# Patient Record
Sex: Female | Born: 1960 | Race: White | Hispanic: No | Marital: Single | Smoking: Never smoker
Health system: Southern US, Community
[De-identification: ages and names within clinical notes are randomized; demographics above are authoritative.]

## PROBLEM LIST (undated history)

## (undated) DIAGNOSIS — I10 Essential (primary) hypertension: Secondary | ICD-10-CM

---

## 2020-12-14 ENCOUNTER — Emergency Department
Admission: EM | Admit: 2020-12-14 | Discharge: 2020-12-15 | Disposition: A | Discharge status: Home / Self Care | Attending: Emergency Medicine | Admitting: Emergency Medicine

## 2020-12-14 ENCOUNTER — Emergency Department

## 2020-12-14 DIAGNOSIS — R451 Restlessness and agitation: Secondary | ICD-10-CM | POA: Insufficient documentation

## 2020-12-14 DIAGNOSIS — S0101XA Laceration without foreign body of scalp, initial encounter: Secondary | ICD-10-CM | POA: Insufficient documentation

## 2020-12-14 DIAGNOSIS — Y92149 Unspecified place in prison as the place of occurrence of the external cause: Secondary | ICD-10-CM | POA: Diagnosis not present

## 2020-12-14 DIAGNOSIS — S0990XA Unspecified injury of head, initial encounter: Secondary | ICD-10-CM

## 2020-12-14 DIAGNOSIS — W1809XA Striking against other object with subsequent fall, initial encounter: Secondary | ICD-10-CM | POA: Diagnosis not present

## 2020-12-14 DIAGNOSIS — F10120 Alcohol abuse with intoxication, uncomplicated: Secondary | ICD-10-CM | POA: Diagnosis not present

## 2020-12-14 DIAGNOSIS — F10920 Alcohol use, unspecified with intoxication, uncomplicated: Secondary | ICD-10-CM

## 2020-12-14 LAB — ETHANOL: Alcohol, Ethyl (B): 206 mg/dL — ABNORMAL HIGH (ref <10)

## 2020-12-14 LAB — COMPREHENSIVE METABOLIC PANEL
ALT: 26 U/L (ref 0–44)
AST: 45 U/L — ABNORMAL HIGH (ref 15–41)
Albumin: 3.9 g/dL (ref 3.5–5.0)
Alkaline Phosphatase: 83 U/L (ref 38–126)
Anion gap: 11 (ref 5–15)
BUN: 9 mg/dL (ref 6–20)
CO2: 23 mmol/L (ref 22–32)
Calcium: 8.9 mg/dL (ref 8.9–10.3)
Chloride: 102 mmol/L (ref 98–111)
Creatinine, Ser: 0.74 mg/dL (ref 0.44–1.00)
GFR, Estimated: >60 mL/min (ref >60)
Glucose, Bld: 108 mg/dL — ABNORMAL HIGH (ref 70–99)
Potassium: 3.7 mmol/L (ref 3.5–5.1)
Sodium: 136 mmol/L (ref 135–145)
Total Bilirubin: 0.6 mg/dL (ref 0.3–1.2)
Total Protein: 7.1 g/dL (ref 6.5–8.1)

## 2020-12-14 LAB — CBC WITH DIFFERENTIAL/PLATELET
Abs Immature Granulocytes: 0.09 10*3/uL — ABNORMAL HIGH (ref 0.00–0.07)
Basophils Absolute: 0.1 10*3/uL (ref 0.0–0.1)
Basophils Relative: 1 %
Eosinophils Absolute: 0 10*3/uL (ref 0.0–0.5)
Eosinophils Relative: 0 %
HCT: 39.1 % (ref 36.0–46.0)
Hemoglobin: 14.2 g/dL (ref 12.0–15.0)
Immature Granulocytes: 1 %
Lymphocytes Relative: 7 %
Lymphs Abs: 0.9 10*3/uL (ref 0.7–4.0)
MCH: 33.3 pg (ref 26.0–34.0)
MCHC: 36.3 g/dL — ABNORMAL HIGH (ref 30.0–36.0)
MCV: 91.8 fL (ref 80.0–100.0)
Monocytes Absolute: 0.9 10*3/uL (ref 0.1–1.0)
Monocytes Relative: 8 %
Neutro Abs: 9.8 10*3/uL — ABNORMAL HIGH (ref 1.7–7.7)
Neutrophils Relative %: 83 %
Platelets: 267 10*3/uL (ref 150–400)
RBC: 4.26 MIL/uL (ref 3.87–5.11)
RDW: 11.8 % (ref 11.5–15.5)
WBC: 11.7 10*3/uL — ABNORMAL HIGH (ref 4.0–10.5)
nRBC: 0 % (ref 0.0–0.2)

## 2020-12-14 MED ORDER — MIDAZOLAM HCL 2 MG/2ML IJ SOLN: 4.0000 mg | Freq: Once | INTRAMUSCULAR | Status: AC

## 2020-12-14 MED ORDER — MIDAZOLAM HCL 2 MG/2ML IJ SOLN
INTRAMUSCULAR | Status: AC
  Administered 2020-12-14: 4 mg via INTRAMUSCULAR
  Filled 2020-12-14: qty 4

## 2020-12-14 MED ORDER — MIDAZOLAM HCL 2 MG/2ML IJ SOLN: 2.0000 mg | Freq: Once | INTRAMUSCULAR | Status: DC

## 2020-12-14 MED ORDER — HALOPERIDOL LACTATE 5 MG/ML IJ SOLN: 2.0000 mg | Freq: Once | INTRAMUSCULAR | Status: DC

## 2020-12-14 NOTE — ED Notes (Signed)
Pt has been given water

## 2020-12-14 NOTE — ED Notes (Signed)
Pt is refusing vitals at this time. 

## 2020-12-14 NOTE — ED Notes (Addendum)
Pt came into room combative and swinging limbs at staff. Pt was verbally aggressive. Pt was combative toward staff and unwilling to participate in care.

## 2020-12-14 NOTE — ED Notes (Signed)
Pt is unwilling to give urine sample at this time.

## 2020-12-14 NOTE — ED Notes (Signed)
Pt is continually be combative to staff and verbally abusive. Pt is still not letting staff assess her injuries or finish triage.

## 2020-12-14 NOTE — ED Notes (Addendum)
Pt back from CT, continuing to refuse VS. On returning from CT, pt handcuffed on right wrist to stretcher, skin under cuff intact, mild redness noted.

## 2020-12-14 NOTE — ED Provider Notes (Signed)
[facility] Emergency Department Provider Note   ____________________________________________   Event Date/Time   First MD Initiated Contact with Patient 12/14/20 2112     (approximate)  I have reviewed the triage vital signs and the nursing notes.   HISTORY  Chief Complaint Laceration and Head Laceration  EM caveat, agitation, confusion  HPI Christina Long is a 60 y.o. female presents for evaluation after head injury  The history is somewhat unclear.  It is reported that the patient was being evaluated or booked into the jail tonight, after being there for a little while she was then noticed to have some type of a head injury possibly strike her head on jail door or something else.  Not quite clear.  EMS reports that she had bleeding, some sort of scalp laceration, they were unable to get the bleeding to stop, and the patient was extremely combative.  She had received 5 mg of intramuscular Haldol and is continued to be combative with EMS.  [place] [facility] [place] this [place] and they have accompanying her to officers  Patient yelling combatively, swearing frequently, calling almost anyone and everyone for phonatory and derogatory names.  She is quite elevated and within only a brief period of time she is yelling and threatening that she is going to sue me for evaluating her today  No past medical history on file.  There are no problems to display for this patient.     Prior to Admission medications   Not on File    Allergies Patient has no allergy information on record.  No family history on file.  Social History    Review of Systems  EM caveat     ____________________________________________   PHYSICAL EXAM:  VITAL SIGNS: ED Triage Vitals [12/14/20 2114]  Enc Vitals Group     BP      Pulse      Resp      Temp      Temp src      SpO2      Weight 135 lb (61.2 kg)     Height 5\' 6"   (1.676 m)     Head Circumference      Peak Flow      Pain Score      Pain Loc      Pain Edu?      Excl. in GC?     Constitutional: Alert and difficult to assess orientation given her level of agitation screaming and profanity laced speech. Eyes: Conjunctivae are injected. Head: Atraumatic except for a approximately 2 cm relatively superficial laceration over the posterior occipital scalp.  Fair amount of dried blood surrounding Nose: No congestion/rhinnorhea. Mouth/Throat: Mucous membranes are moist. Neck: No stridor.  Cardiovascular: Tachycardic rate, regular rhythm. Grossly normal heart sounds.  Good peripheral circulation. Respiratory: Relatively normal work of breathing, and is able to scream out and yell at the top of her lungs without difficulty. Gastrointestinal: No distention. Musculoskeletal: No lower extremity tenderness nor edema.  She is able to sit herself up and fight combatively against restraints that the sheriff has on her right hand and left leg.  No evidence of poor perfusion or injury to either extremity.  No obvious extremity injuries. Neurologic: Slurred speech elevated pressured speech.  Very highly agitated Skin:  Skin is warm, dry and intact. No rash noted. Psychiatric: Mood and affect are highly elevated threatening behavior toward staff as well [facility].  Refusing vital signs repeatedly. ____________________________________________   LABS (all labs ordered are listed, but only abnormal results are displayed)  Labs Reviewed  ETHANOL - Abnormal; Notable for the following components:      Result Value   Alcohol, Ethyl (B) 206 (*)    All other components within normal limits  CBC WITH DIFFERENTIAL/PLATELET - Abnormal; Notable for the following components:   WBC 11.7 (*)    MCHC 36.3 (*)    Neutro Abs 9.8 (*)    Abs Immature Granulocytes 0.09 (*)    All other components within normal limits  COMPREHENSIVE METABOLIC PANEL -  Abnormal; Notable for the following components:   Glucose, Bld 108 (*)    AST 45 (*)    All other components within normal limits  URINE DRUG SCREEN, QUALITATIVE ([facility] ONLY)   ____________________________________________  EKG   ____________________________________________  RADIOLOGY The head and cervical spine reviewed, read of the radiologist also reviewed.  No evidence of acute intracranial or cervical pathology.  Initial read updated after discussion with radiologist, no fractures.   ____________________________________________   PROCEDURES  Procedure(s) performed: Laceration repair  .Marland KitchenLaceration Repair  Date/Time: 12/14/2020 11:30 PM Performed by: Sharyn Creamer, MD Authorized by: Sharyn Creamer, MD   Consent:    Consent obtained:  Verbal (Patient would not consent to sutures or staples only to tissue gluing)   Consent given by:  Patient   Risks discussed:  Infection, pain, poor cosmetic result, need for additional repair, poor wound healing and retained foreign body   Alternatives discussed:  No treatment Universal protocol:    Relevant documents present and verified: yes     Imaging studies available: yes     Patient identity confirmed:  Verbally with patient Anesthesia:    Anesthesia method:  None Laceration details:    Location:  Scalp   Scalp location:  Occipital   Length (cm):  2   Depth (mm):  4 Exploration:    Limited defect created (wound extended): yes     Contaminated: no   Treatment:    Amount of cleaning:  Standard   Irrigation solution:  Sterile saline   Visualized foreign bodies/material removed: no   Skin repair:    Repair method:  Tissue adhesive Approximation:    Approximation:  Close Repair type:    Repair type:  Simple Post-procedure details:    Dressing:  Open (no dressing)   Procedure completion:  Tolerated well, no immediate complications    Critical Care performed: Yes, see critical care note(s)  CRITICAL CARE Performed by: Sharyn Creamer   Total critical care time: 35 minutes  Critical care time was exclusive of separately billable procedures and treating other patients.  Critical care was necessary to treat or prevent imminent or life-threatening deterioration.  Critical care was time spent personally by me on the following activities: development of treatment plan with patient and/or surrogate as well as nursing, discussions with consultants, evaluation of patient's response to treatment, examination of patient, obtaining history from patient or surrogate, ordering and performing treatments and interventions, ordering and review of laboratory studies, ordering and review of radiographic studies, pulse oximetry and re-evaluation of patient's condition.  ____________________________________________   INITIAL IMPRESSION / ASSESSMENT AND PLAN / ED COURSE  Pertinent labs & imaging results that were available during my care of the patient were reviewed by me and considered in my medical decision making (see chart for details).   Patient presents, evidently was being booked for concerns of possible alcohol and drug  abuse.  Evidently had no injury on presentation to the jail and then is felt to have had struck her head fallen or some type of injury occurring.  On arrival she is extremely agitated belligerent, obvious dried blood on the back of her scalp.  Unable to obtain good assessment given the patient's level of agitation and combativeness however.  Differential diagnosis certainly includes intracranial injury, unlikely cervical injury, patient will not tolerate wearing of a cervical collar at this time given her level of agitation.  I do not see obvious or gross evidence of neurologic deficit.  She does however have slurred speech, and very combative nature.  It is unclear to me if this could be related to substance abuse, metabolic abnormality, ethanol abuse, or traumatic injury.  However, I favor this may be alcohol or  drug related or precipitated given the history  Clinical Course as of 12/14/20 2345  Mon Dec 14, 2020  2114 Very combative, belligerent, yelling about and attempting to hit staff.  She has obvious bleeding summer from her scalp with concerns for head injury.  She also seems to be perseverating to some degree with slight slurring of speech.  The patient is not felt to have capacity, and I am concerned about an emergent medical condition including potential significant head injury at this point.  The patient is not able to be redirected with verbal de-escalation and is becoming progressively more combative. IV versed ordered with manual hold for administration.  [MQ]  2117 Appears to be coming somewhat, but still profanity laced speech, slurred speech, yelling at Designer, television/film set.  She is not compliant with care requests at this point but is alert, has dried blood across her scalp but I do not see evidence of active bleeding at this point though difficult to evaluate given her level of agitation when approached.  Hopefully midazolam will begin to have calming effect in combination with Haldol given by EMS [MQ]  2125 Patient yelling at me when trying assess her now, reporting I am a "gay" and to "keep your legs crossed"... near continuous use of the f word now  [MQ]  2135 Patient still somewhat agitated seems to be slowly calming.  No distress.  Still will not allow close examination due to agitation level when approached [MQ]  2235 Patient is much calmer now, compliant with care.  She allowed laceration repair with Dermabond small laceration on the scalp.  The patient adamantly refused to allow suturing or staple repair, and given the need for repair decision made to utilize Dermabond which I think use and worked quite well and effectively.  The area was cleansed irrigated previous to repair [MQ]  2329 The patient is now calm and resting.  She was able to undergo CT scan.  Laceration repaired. [MQ]     Clinical Course User Index [MQ] Sharyn Creamer, MD   Anticipate some delay in obtaining labs and blood work while we provide anxiolytic and antipsychotic medication to the patient to at least calm and facilitate evaluation in the ER.  ----------------------------------------- 11:43 PM on 12/14/2020 -----------------------------------------  Patient is resting at this time.  I suspect her elevated ethanol level is likely the cause of her agitation, possibly some related also to the fact that after he had a head injury.  She has very reassuring work-up to this point, she continues to refuse vital signs but I am able to assess that her respiratory rate is currently 14, she is resting comfortably has a strong and  normal radial pulse and is warm and well-perfused.  However the patient is refusing to allow placement of ECG electrodes and blood pressure at this time.  We will allow the patient to further be observed clinically for also for sobriety.  I suspect thereafter hopefully she will come more compliant allow [place] to complete our assessment.  I suspect if the patient mental baseline improves, agitation improved that she will likely be able to be discharged with head injury and scalp laceration  precautions.  Ongoing care signed Dr. Elesa Massed at this time  ____________________________________________   FINAL CLINICAL IMPRESSION(S) / ED DIAGNOSES  Final diagnoses:  Alcoholic intoxication without complication (HCC)  Agitation  Scalp laceration, initial encounter  Closed head injury, initial encounter        Note:  This document was prepared using Dragon voice recognition software and may include unintentional dictation errors       Sharyn Creamer, MD 12/14/20 2345

## 2020-12-15 ENCOUNTER — Other Ambulatory Visit: Payer: Self-pay

## 2020-12-15 NOTE — ED Notes (Signed)
Pt ambulatory to bathroom independently with steady gait.
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
## 2020-12-15 NOTE — ED Notes (Signed)
Pt refused discharge vital signs and discharge e-signature, verbalized understanding of discharge teaching.

## 2020-12-15 NOTE — ED Notes (Signed)
Pt provided water for fluid challenge 

## 2021-04-01 ENCOUNTER — Other Ambulatory Visit: Payer: Self-pay

## 2021-04-01 ENCOUNTER — Encounter: Payer: Self-pay | Admitting: Emergency Medicine

## 2021-04-01 ENCOUNTER — Ambulatory Visit
Admission: EM | Admit: 2021-04-01 | Discharge: 2021-04-01 | Disposition: A | Discharge status: Home / Self Care | Payer: BLUE CROSS/BLUE SHIELD | Attending: Internal Medicine | Admitting: Internal Medicine

## 2021-04-01 DIAGNOSIS — E871 Hypo-osmolality and hyponatremia: Secondary | ICD-10-CM | POA: Diagnosis not present

## 2021-04-01 DIAGNOSIS — E878 Other disorders of electrolyte and fluid balance, not elsewhere classified: Secondary | ICD-10-CM | POA: Diagnosis not present

## 2021-04-01 DIAGNOSIS — K529 Noninfective gastroenteritis and colitis, unspecified: Secondary | ICD-10-CM | POA: Diagnosis not present

## 2021-04-01 LAB — COMPREHENSIVE METABOLIC PANEL
ALT: 37 U/L (ref 0–44)
AST: 104 U/L — ABNORMAL HIGH (ref 15–41)
Albumin: 3.7 g/dL (ref 3.5–5.0)
Alkaline Phosphatase: 82 U/L (ref 38–126)
Anion gap: 11 (ref 5–15)
BUN: 7 mg/dL (ref 6–20)
CO2: 26 mmol/L (ref 22–32)
Calcium: 8.8 mg/dL — ABNORMAL LOW (ref 8.9–10.3)
Chloride: 97 mmol/L — ABNORMAL LOW (ref 98–111)
Creatinine, Ser: 0.77 mg/dL (ref 0.44–1.00)
GFR, Estimated: >60 mL/min (ref >60)
Glucose, Bld: 129 mg/dL — ABNORMAL HIGH (ref 70–99)
Potassium: 3.8 mmol/L (ref 3.5–5.1)
Sodium: 134 mmol/L — ABNORMAL LOW (ref 135–145)
Total Bilirubin: 1 mg/dL (ref 0.3–1.2)
Total Protein: 7.4 g/dL (ref 6.5–8.1)

## 2021-04-01 LAB — CBC WITH DIFFERENTIAL/PLATELET
Abs Immature Granulocytes: 0.01 10*3/uL (ref 0.00–0.07)
Basophils Absolute: 0 10*3/uL (ref 0.0–0.1)
Basophils Relative: 1 %
Eosinophils Absolute: 0.1 10*3/uL (ref 0.0–0.5)
Eosinophils Relative: 1 %
HCT: 39 % (ref 36.0–46.0)
Hemoglobin: 14.3 g/dL (ref 12.0–15.0)
Immature Granulocytes: 0 %
Lymphocytes Relative: 21 %
Lymphs Abs: 1 10*3/uL (ref 0.7–4.0)
MCH: 32.3 pg (ref 26.0–34.0)
MCHC: 36.7 g/dL — ABNORMAL HIGH (ref 30.0–36.0)
MCV: 88 fL (ref 80.0–100.0)
Monocytes Absolute: 0.5 10*3/uL (ref 0.1–1.0)
Monocytes Relative: 11 %
Neutro Abs: 3 10*3/uL (ref 1.7–7.7)
Neutrophils Relative %: 66 %
Platelets: 247 10*3/uL (ref 150–400)
RBC: 4.43 MIL/uL (ref 3.87–5.11)
RDW: 11.7 % (ref 11.5–15.5)
WBC: 4.5 10*3/uL (ref 4.0–10.5)
nRBC: 0 % (ref 0.0–0.2)

## 2021-04-01 LAB — URINALYSIS, COMPLETE (UACMP) WITH MICROSCOPIC
Bilirubin Urine: NEGATIVE
Glucose, UA: NEGATIVE mg/dL
Ketones, ur: NEGATIVE mg/dL
Leukocytes,Ua: NEGATIVE
Nitrite: NEGATIVE
Protein, ur: NEGATIVE mg/dL
Specific Gravity, Urine: 1.01 (ref 1.005–1.030)
pH: 6.5 (ref 5.0–8.0)

## 2021-04-01 LAB — GLUCOSE, CAPILLARY: Glucose-Capillary: 104 mg/dL — ABNORMAL HIGH (ref 70–99)

## 2021-04-01 NOTE — Discharge Instructions (Addendum)
Your white cell count is normal and does not show signs of bacterial infection Your urine only shows rare red cells, but no white cells which are present with infection. We will send your urine for a culture. It also shows you are well hydrated. Your chemistry shows your sodium and chloride are a little low which happen from vomiting and low sodium can be from over hydration as well. Low Chloride can also occur with poor adrenal function.  But this does not show you are dehydrated. Your glucose is a little elevated and one of your liver enzymes is a little elevated, the rest are fine.  Your blood pressure is very high and it  drops a lot from sitting to standing and this is concerning. Please go do the ER today to have more tests done.   Call [facility] for follow up Address: [address] [place], [place] [address] Hours:  Thursday 8AM-5PM Friday 8AM-4:30PM Saturday Closed Sunday Closed Monday 8AM-5PM Tuesday 8AM-5PM Wednesday 8AM-5PM Phone: [phone]
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
## 2021-04-01 NOTE — ED Triage Notes (Signed)
Pt c/o diarrhea and vomiting. Pt states she has been having these symptoms for 15 days. She was recently seen at [facility] ED and treated for UTI, and dehydration.

## 2021-04-01 NOTE — ED Notes (Signed)
Patient is being discharged from the Urgent Care and sent to the Emergency Department via POV. Per Beatrix Fetters, PA, patient is in need of higher level of care. Patient is aware and verbalizes understanding of plan of care.  Vitals:   04/01/21 1200  BP: (!) 183/130  Pulse: 95  Resp: 18  Temp: 98.7 F (37.1 C)  SpO2: 99%

## 2021-04-03 LAB — URINE CULTURE

## 2021-04-29 ENCOUNTER — Emergency Department
Admission: EM | Admit: 2021-04-29 | Discharge: 2021-04-29 | Disposition: A | Discharge status: Home / Self Care | Payer: BLUE CROSS/BLUE SHIELD | Attending: Emergency Medicine | Admitting: Emergency Medicine

## 2021-04-29 ENCOUNTER — Other Ambulatory Visit: Payer: Self-pay

## 2021-04-29 DIAGNOSIS — Y908 Blood alcohol level of 240 mg/100 ml or more: Secondary | ICD-10-CM | POA: Insufficient documentation

## 2021-04-29 DIAGNOSIS — Z5321 Procedure and treatment not carried out due to patient leaving prior to being seen by health care provider: Secondary | ICD-10-CM | POA: Insufficient documentation

## 2021-04-29 DIAGNOSIS — F10988 Alcohol use, unspecified with other alcohol-induced disorder: Secondary | ICD-10-CM | POA: Insufficient documentation

## 2021-04-29 DIAGNOSIS — Z20822 Contact with and (suspected) exposure to covid-19: Secondary | ICD-10-CM | POA: Insufficient documentation

## 2021-04-29 DIAGNOSIS — R451 Restlessness and agitation: Secondary | ICD-10-CM | POA: Diagnosis present

## 2021-04-29 DIAGNOSIS — R233 Spontaneous ecchymoses: Secondary | ICD-10-CM | POA: Insufficient documentation

## 2021-04-29 DIAGNOSIS — R197 Diarrhea, unspecified: Secondary | ICD-10-CM | POA: Diagnosis not present

## 2021-04-29 DIAGNOSIS — F1290 Cannabis use, unspecified, uncomplicated: Secondary | ICD-10-CM | POA: Insufficient documentation

## 2021-04-29 HISTORY — DX: Essential (primary) hypertension: I10

## 2021-04-29 LAB — ETHANOL: Alcohol, Ethyl (B): 277 mg/dL — ABNORMAL HIGH (ref <10)

## 2021-04-29 LAB — URINE DRUG SCREEN, QUALITATIVE (ARMC ONLY)
Amphetamines, Ur Screen: NOT DETECTED
Barbiturates, Ur Screen: NOT DETECTED
Benzodiazepine, Ur Scrn: NOT DETECTED
Cannabinoid 50 Ng, Ur ~~LOC~~: NOT DETECTED
Cocaine Metabolite,Ur ~~LOC~~: NOT DETECTED
MDMA (Ecstasy)Ur Screen: NOT DETECTED
Methadone Scn, Ur: NOT DETECTED
Opiate, Ur Screen: NOT DETECTED
Phencyclidine (PCP) Ur S: NOT DETECTED
Tricyclic, Ur Screen: NOT DETECTED

## 2021-04-29 LAB — COMPREHENSIVE METABOLIC PANEL
ALT: 72 U/L — ABNORMAL HIGH (ref 0–44)
AST: 124 U/L — ABNORMAL HIGH (ref 15–41)
Albumin: 3.6 g/dL (ref 3.5–5.0)
Alkaline Phosphatase: 98 U/L (ref 38–126)
Anion gap: 10 (ref 5–15)
BUN: 11 mg/dL (ref 6–20)
CO2: 23 mmol/L (ref 22–32)
Calcium: 8.3 mg/dL — ABNORMAL LOW (ref 8.9–10.3)
Chloride: 100 mmol/L (ref 98–111)
Creatinine, Ser: 0.8 mg/dL (ref 0.44–1.00)
GFR, Estimated: >60 mL/min (ref >60)
Glucose, Bld: 152 mg/dL — ABNORMAL HIGH (ref 70–99)
Potassium: 3.8 mmol/L (ref 3.5–5.1)
Sodium: 133 mmol/L — ABNORMAL LOW (ref 135–145)
Total Bilirubin: 0.9 mg/dL (ref 0.3–1.2)
Total Protein: 6.6 g/dL (ref 6.5–8.1)

## 2021-04-29 LAB — CBC
HCT: 37.7 % (ref 36.0–46.0)
Hemoglobin: 14.1 g/dL (ref 12.0–15.0)
MCH: 34.4 pg — ABNORMAL HIGH (ref 26.0–34.0)
MCHC: 37.4 g/dL — ABNORMAL HIGH (ref 30.0–36.0)
MCV: 92 fL (ref 80.0–100.0)
Platelets: 276 10*3/uL (ref 150–400)
RBC: 4.1 MIL/uL (ref 3.87–5.11)
RDW: 13.1 % (ref 11.5–15.5)
WBC: 5.4 10*3/uL (ref 4.0–10.5)
nRBC: 0 % (ref 0.0–0.2)

## 2021-04-29 LAB — RESP PANEL BY RT-PCR (FLU A&B, COVID) ARPGX2
Influenza A by PCR: NEGATIVE
Influenza B by PCR: NEGATIVE
SARS Coronavirus 2 by RT PCR: NEGATIVE

## 2021-04-29 LAB — SALICYLATE LEVEL: Salicylate Lvl: <7 mg/dL — ABNORMAL LOW (ref 7.0–30.0)

## 2021-04-29 LAB — ACETAMINOPHEN LEVEL: Acetaminophen (Tylenol), Serum: <10 ug/mL — ABNORMAL LOW (ref 10–30)
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
## 2021-04-29 NOTE — ED Notes (Signed)
Pt changed in to wine scrubs, and mesh underwear and non slip socks, pt belongings: Black t shirt, black leggings, black sandals, 2 bags of belongings brought by SO all bagged and labeled.

## 2021-04-30 ENCOUNTER — Emergency Department (EMERGENCY_DEPARTMENT_HOSPITAL)
Admission: EM | Admit: 2021-04-30 | Discharge: 2021-05-01 | Disposition: A | Discharge status: Other Acute Inpatient Hospital | Payer: BLUE CROSS/BLUE SHIELD | Source: Home / Self Care | Attending: Emergency Medicine | Admitting: Emergency Medicine

## 2021-04-30 DIAGNOSIS — T7491XA Unspecified adult maltreatment, confirmed, initial encounter: Secondary | ICD-10-CM

## 2021-04-30 DIAGNOSIS — R456 Violent behavior: Secondary | ICD-10-CM | POA: Insufficient documentation

## 2021-04-30 DIAGNOSIS — S7001XA Contusion of right hip, initial encounter: Secondary | ICD-10-CM | POA: Insufficient documentation

## 2021-04-30 DIAGNOSIS — F10129 Alcohol abuse with intoxication, unspecified: Secondary | ICD-10-CM | POA: Insufficient documentation

## 2021-04-30 DIAGNOSIS — Z79899 Other long term (current) drug therapy: Secondary | ICD-10-CM | POA: Insufficient documentation

## 2021-04-30 DIAGNOSIS — F1094 Alcohol use, unspecified with alcohol-induced mood disorder: Secondary | ICD-10-CM | POA: Diagnosis present

## 2021-04-30 DIAGNOSIS — I10 Essential (primary) hypertension: Secondary | ICD-10-CM | POA: Insufficient documentation

## 2021-04-30 DIAGNOSIS — Z20822 Contact with and (suspected) exposure to covid-19: Secondary | ICD-10-CM | POA: Insufficient documentation

## 2021-04-30 DIAGNOSIS — Y908 Blood alcohol level of 240 mg/100 ml or more: Secondary | ICD-10-CM | POA: Insufficient documentation

## 2021-04-30 DIAGNOSIS — F332 Major depressive disorder, recurrent severe without psychotic features: Secondary | ICD-10-CM | POA: Insufficient documentation

## 2021-04-30 DIAGNOSIS — S8011XA Contusion of right lower leg, initial encounter: Secondary | ICD-10-CM | POA: Insufficient documentation

## 2021-04-30 DIAGNOSIS — S8012XA Contusion of left lower leg, initial encounter: Secondary | ICD-10-CM | POA: Insufficient documentation

## 2021-04-30 DIAGNOSIS — R45851 Suicidal ideations: Secondary | ICD-10-CM | POA: Insufficient documentation

## 2021-04-30 DIAGNOSIS — S40022A Contusion of left upper arm, initial encounter: Secondary | ICD-10-CM | POA: Insufficient documentation

## 2021-04-30 DIAGNOSIS — S40021A Contusion of right upper arm, initial encounter: Secondary | ICD-10-CM | POA: Insufficient documentation

## 2021-04-30 DIAGNOSIS — F10929 Alcohol use, unspecified with intoxication, unspecified: Secondary | ICD-10-CM

## 2021-04-30 DIAGNOSIS — X58XXXA Exposure to other specified factors, initial encounter: Secondary | ICD-10-CM | POA: Insufficient documentation

## 2021-04-30 DIAGNOSIS — R4689 Other symptoms and signs involving appearance and behavior: Secondary | ICD-10-CM

## 2021-04-30 DIAGNOSIS — T148XXA Other injury of unspecified body region, initial encounter: Secondary | ICD-10-CM

## 2021-04-30 LAB — ACETAMINOPHEN LEVEL: Acetaminophen (Tylenol), Serum: <10 ug/mL — ABNORMAL LOW (ref 10–30)

## 2021-04-30 LAB — CBC WITH DIFFERENTIAL/PLATELET
Abs Immature Granulocytes: 0.01 10*3/uL (ref 0.00–0.07)
Basophils Absolute: 0.1 10*3/uL (ref 0.0–0.1)
Basophils Relative: 1 %
Eosinophils Absolute: 0 10*3/uL (ref 0.0–0.5)
Eosinophils Relative: 0 %
HCT: 38.5 % (ref 36.0–46.0)
Hemoglobin: 14.2 g/dL (ref 12.0–15.0)
Immature Granulocytes: 0 %
Lymphocytes Relative: 40 %
Lymphs Abs: 2.2 10*3/uL (ref 0.7–4.0)
MCH: 34.1 pg — ABNORMAL HIGH (ref 26.0–34.0)
MCHC: 36.9 g/dL — ABNORMAL HIGH (ref 30.0–36.0)
MCV: 92.5 fL (ref 80.0–100.0)
Monocytes Absolute: 0.8 10*3/uL (ref 0.1–1.0)
Monocytes Relative: 15 %
Neutro Abs: 2.3 10*3/uL (ref 1.7–7.7)
Neutrophils Relative %: 44 %
Platelets: 258 10*3/uL (ref 150–400)
RBC: 4.16 MIL/uL (ref 3.87–5.11)
RDW: 13 % (ref 11.5–15.5)
WBC: 5.4 10*3/uL (ref 4.0–10.5)
nRBC: 0 % (ref 0.0–0.2)

## 2021-04-30 LAB — COMPREHENSIVE METABOLIC PANEL
ALT: 95 U/L — ABNORMAL HIGH (ref 0–44)
AST: 196 U/L — ABNORMAL HIGH (ref 15–41)
Albumin: 3.4 g/dL — ABNORMAL LOW (ref 3.5–5.0)
Alkaline Phosphatase: 77 U/L (ref 38–126)
Anion gap: 12 (ref 5–15)
BUN: 12 mg/dL (ref 6–20)
CO2: 22 mmol/L (ref 22–32)
Calcium: 8.4 mg/dL — ABNORMAL LOW (ref 8.9–10.3)
Chloride: 99 mmol/L (ref 98–111)
Creatinine, Ser: 0.78 mg/dL (ref 0.44–1.00)
GFR, Estimated: >60 mL/min (ref >60)
Glucose, Bld: 145 mg/dL — ABNORMAL HIGH (ref 70–99)
Potassium: 3.8 mmol/L (ref 3.5–5.1)
Sodium: 133 mmol/L — ABNORMAL LOW (ref 135–145)
Total Bilirubin: 1.1 mg/dL (ref 0.3–1.2)
Total Protein: 6.2 g/dL — ABNORMAL LOW (ref 6.5–8.1)

## 2021-04-30 LAB — SALICYLATE LEVEL: Salicylate Lvl: <7 mg/dL — ABNORMAL LOW (ref 7.0–30.0)

## 2021-04-30 LAB — ETHANOL: Alcohol, Ethyl (B): 278 mg/dL — ABNORMAL HIGH (ref <10)

## 2021-04-30 MED ORDER — MIDAZOLAM HCL 2 MG/2ML IJ SOLN
INTRAMUSCULAR | Status: AC
  Filled 2021-04-30: qty 2

## 2021-04-30 MED ORDER — DIPHENHYDRAMINE HCL 50 MG/ML IJ SOLN: 50.0000 mg | Freq: Once | INTRAMUSCULAR | Status: DC

## 2021-04-30 MED ORDER — DIPHENHYDRAMINE HCL 50 MG/ML IJ SOLN
INTRAMUSCULAR | Status: AC
  Filled 2021-04-30: qty 1

## 2021-04-30 MED ORDER — MIDAZOLAM HCL 2 MG/2ML IJ SOLN
2.0000 mg | Freq: Once | INTRAMUSCULAR | Status: AC
  Administered 2021-04-30: 2 mg via INTRAMUSCULAR

## 2021-04-30 MED ORDER — HALOPERIDOL LACTATE 5 MG/ML IJ SOLN
INTRAMUSCULAR | Status: AC
  Filled 2021-04-30: qty 1

## 2021-04-30 MED ORDER — HALOPERIDOL LACTATE 5 MG/ML IJ SOLN
5.0000 mg | Freq: Once | INTRAMUSCULAR | Status: AC
  Administered 2021-04-30: 5 mg via INTRAMUSCULAR

## 2021-04-30 MED ORDER — DIPHENHYDRAMINE HCL 50 MG/ML IJ SOLN
50.0000 mg | Freq: Once | INTRAMUSCULAR | Status: AC
  Administered 2021-04-30: 50 mg via INTRAMUSCULAR
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
## 2021-05-01 ENCOUNTER — Inpatient Hospital Stay
Admission: RE | Admit: 2021-05-01 | Discharge: 2021-05-03 | DRG: 885 | Disposition: A | Discharge status: Home / Self Care | Payer: BLUE CROSS/BLUE SHIELD | Source: Intra-hospital | Attending: Behavioral Health | Admitting: Behavioral Health

## 2021-05-01 DIAGNOSIS — I10 Essential (primary) hypertension: Secondary | ICD-10-CM | POA: Diagnosis present

## 2021-05-01 DIAGNOSIS — G4700 Insomnia, unspecified: Secondary | ICD-10-CM | POA: Diagnosis present

## 2021-05-01 DIAGNOSIS — Z20822 Contact with and (suspected) exposure to covid-19: Secondary | ICD-10-CM | POA: Diagnosis present

## 2021-05-01 DIAGNOSIS — R45851 Suicidal ideations: Secondary | ICD-10-CM | POA: Diagnosis present

## 2021-05-01 DIAGNOSIS — F919 Conduct disorder, unspecified: Secondary | ICD-10-CM | POA: Diagnosis present

## 2021-05-01 DIAGNOSIS — F1020 Alcohol dependence, uncomplicated: Secondary | ICD-10-CM | POA: Diagnosis not present

## 2021-05-01 DIAGNOSIS — F339 Major depressive disorder, recurrent, unspecified: Secondary | ICD-10-CM | POA: Diagnosis not present

## 2021-05-01 DIAGNOSIS — F10129 Alcohol abuse with intoxication, unspecified: Secondary | ICD-10-CM | POA: Diagnosis present

## 2021-05-01 DIAGNOSIS — Y908 Blood alcohol level of 240 mg/100 ml or more: Secondary | ICD-10-CM | POA: Diagnosis present

## 2021-05-01 DIAGNOSIS — F1094 Alcohol use, unspecified with alcohol-induced mood disorder: Secondary | ICD-10-CM | POA: Diagnosis present

## 2021-05-01 DIAGNOSIS — R45850 Homicidal ideations: Secondary | ICD-10-CM | POA: Diagnosis present

## 2021-05-01 DIAGNOSIS — F10929 Alcohol use, unspecified with intoxication, unspecified: Secondary | ICD-10-CM | POA: Diagnosis present

## 2021-05-01 DIAGNOSIS — F332 Major depressive disorder, recurrent severe without psychotic features: Secondary | ICD-10-CM | POA: Diagnosis present

## 2021-05-01 DIAGNOSIS — F329 Major depressive disorder, single episode, unspecified: Secondary | ICD-10-CM | POA: Diagnosis present

## 2021-05-01 DIAGNOSIS — F419 Anxiety disorder, unspecified: Secondary | ICD-10-CM | POA: Diagnosis present

## 2021-05-01 LAB — URINE DRUG SCREEN, QUALITATIVE (ARMC ONLY)
Amphetamines, Ur Screen: NOT DETECTED
Barbiturates, Ur Screen: NOT DETECTED
Benzodiazepine, Ur Scrn: POSITIVE — AB
Cannabinoid 50 Ng, Ur ~~LOC~~: NOT DETECTED
Cocaine Metabolite,Ur ~~LOC~~: NOT DETECTED
MDMA (Ecstasy)Ur Screen: NOT DETECTED
Methadone Scn, Ur: NOT DETECTED
Opiate, Ur Screen: NOT DETECTED
Phencyclidine (PCP) Ur S: NOT DETECTED
Tricyclic, Ur Screen: NOT DETECTED

## 2021-05-01 LAB — URINALYSIS, COMPLETE (UACMP) WITH MICROSCOPIC
Bacteria, UA: NONE SEEN
Bilirubin Urine: NEGATIVE
Glucose, UA: NEGATIVE mg/dL
Hgb urine dipstick: NEGATIVE
Ketones, ur: NEGATIVE mg/dL
Leukocytes,Ua: NEGATIVE
Nitrite: NEGATIVE
Protein, ur: NEGATIVE mg/dL
Specific Gravity, Urine: 1.008 (ref 1.005–1.030)
pH: 6 (ref 5.0–8.0)

## 2021-05-01 LAB — RESP PANEL BY RT-PCR (FLU A&B, COVID) ARPGX2
Influenza A by PCR: NEGATIVE
Influenza B by PCR: NEGATIVE
SARS Coronavirus 2 by RT PCR: NEGATIVE

## 2021-05-01 MED ORDER — ALUM & MAG HYDROXIDE-SIMETH 200-200-20 MG/5ML PO SUSP: 30.0000 mL | ORAL | Status: DC | PRN

## 2021-05-01 MED ORDER — TRAZODONE HCL 100 MG PO TABS
100.0000 mg | ORAL_TABLET | Freq: Every day | ORAL | Status: DC
  Administered 2021-05-01: 100 mg via ORAL
  Filled 2021-05-01: qty 1

## 2021-05-01 MED ORDER — LORAZEPAM 1 MG PO TABS
1.0000 mg | ORAL_TABLET | Freq: Every day | ORAL | Status: DC
Start: 2021-05-04 — End: 2021-05-01

## 2021-05-01 MED ORDER — LOSARTAN POTASSIUM 50 MG PO TABS
100.0000 mg | ORAL_TABLET | Freq: Every day | ORAL | Status: DC
  Administered 2021-05-01: 100 mg via ORAL
  Filled 2021-05-01 (×2): qty 2

## 2021-05-01 MED ORDER — LOPERAMIDE HCL 2 MG PO CAPS
2.0000 mg | ORAL_CAPSULE | ORAL | Status: DC | PRN
  Filled 2021-05-01: qty 2

## 2021-05-01 MED ORDER — MAGNESIUM HYDROXIDE 400 MG/5ML PO SUSP: 30.0000 mL | Freq: Every day | ORAL | Status: DC | PRN

## 2021-05-01 MED ORDER — LORAZEPAM 1 MG PO TABS: 1.0000 mg | ORAL_TABLET | Freq: Four times a day (QID) | ORAL | Status: DC | PRN

## 2021-05-01 MED ORDER — ACETAMINOPHEN 325 MG PO TABS
650.0000 mg | ORAL_TABLET | Freq: Four times a day (QID) | ORAL | Status: DC | PRN
  Administered 2021-05-02: 650 mg via ORAL
  Filled 2021-05-01: qty 2

## 2021-05-01 MED ORDER — ONDANSETRON 4 MG PO TBDP
4.0000 mg | ORAL_TABLET | Freq: Four times a day (QID) | ORAL | Status: DC | PRN
  Filled 2021-05-01: qty 1

## 2021-05-01 MED ORDER — ACETAMINOPHEN 500 MG PO TABS
1000.0000 mg | ORAL_TABLET | Freq: Once | ORAL | Status: AC
  Administered 2021-05-01: 1000 mg via ORAL
  Filled 2021-05-01: qty 2

## 2021-05-01 MED ORDER — VENLAFAXINE HCL ER 75 MG PO CP24
75.0000 mg | ORAL_CAPSULE | Freq: Every day | ORAL | Status: DC
  Administered 2021-05-01: 75 mg via ORAL
  Filled 2021-05-01 (×2): qty 1

## 2021-05-01 MED ORDER — LORAZEPAM 1 MG PO TABS: 1.0000 mg | ORAL_TABLET | Freq: Three times a day (TID) | ORAL | Status: DC

## 2021-05-01 MED ORDER — THIAMINE HCL 100 MG/ML IJ SOLN
100.0000 mg | Freq: Once | INTRAMUSCULAR | Status: AC
Start: 2021-05-01 — End: 2021-05-01
  Administered 2021-05-01: 100 mg via INTRAMUSCULAR
  Filled 2021-05-01: qty 2

## 2021-05-01 MED ORDER — BUPROPION HCL ER (SR) 150 MG PO TB12: 150.0000 mg | ORAL_TABLET | Freq: Two times a day (BID) | ORAL | Status: DC

## 2021-05-01 MED ORDER — CARVEDILOL 3.125 MG PO TABS
3.1250 mg | ORAL_TABLET | Freq: Two times a day (BID) | ORAL | Status: DC
  Administered 2021-05-01 (×2): 3.125 mg via ORAL
  Filled 2021-05-01 (×3): qty 1

## 2021-05-01 MED ORDER — LORAZEPAM 1 MG PO TABS: 1.0000 mg | ORAL_TABLET | Freq: Two times a day (BID) | ORAL | Status: DC

## 2021-05-01 MED ORDER — THIAMINE HCL 100 MG PO TABS: 100.0000 mg | ORAL_TABLET | Freq: Every day | ORAL | Status: DC

## 2021-05-01 MED ORDER — TRAZODONE HCL 50 MG PO TABS: 50.0000 mg | ORAL_TABLET | Freq: Every evening | ORAL | Status: DC | PRN

## 2021-05-01 MED ORDER — LORAZEPAM 1 MG PO TABS
1.0000 mg | ORAL_TABLET | Freq: Four times a day (QID) | ORAL | Status: AC
Start: 2021-05-01 — End: 2021-05-01
  Administered 2021-05-01 (×4): 1 mg via ORAL
  Filled 2021-05-01 (×4): qty 1

## 2021-05-01 MED ORDER — ADULT MULTIVITAMIN W/MINERALS CH
1.0000 | ORAL_TABLET | Freq: Every day | ORAL | Status: DC
Start: 2021-05-01 — End: 2021-05-01
  Administered 2021-05-01: 1 via ORAL
  Filled 2021-05-01: qty 1

## 2021-05-01 MED ORDER — HYDROXYZINE HCL 25 MG PO TABS: 25.0000 mg | ORAL_TABLET | Freq: Four times a day (QID) | ORAL | Status: DC | PRN

## 2021-05-01 MED ORDER — BUPROPION HCL ER (SR) 100 MG PO TB12
100.0000 mg | ORAL_TABLET | Freq: Two times a day (BID) | ORAL | Status: DC
  Administered 2021-05-01 (×2): 100 mg via ORAL
  Filled 2021-05-01 (×4): qty 1
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
## 2021-05-01 NOTE — ED Notes (Signed)
Hourly rounding reveals patient in room. No complaints, stable, in no acute distress. Q15 minute rounds and monitoring via Security Cameras to continue. 

## 2021-05-01 NOTE — BH Assessment (Signed)
Patient is to be admitted to [facility] by Psychiatric Nurse Practitioner Nanine Means.  Attending Physician will be Dr. Neale Burly.   Patient has been assigned to room 320, by [facility] Charge Nurse Britta Mccreedy.   Intake Paper Work has been signed and placed on patient chart.  ER staff is aware of the admission: Lynden Ang, ER Secretary   Dr. Katrinka Blazing, ER MD  Lacinda Axon, Patient's Nurse  Connye Burkitt, Patient Access.

## 2021-05-01 NOTE — ED Notes (Addendum)
Pt. Was given her breakfast tray. 
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
## 2021-05-01 NOTE — ED Notes (Signed)
Report to include Situation, Background, Assessment, and Recommendations received from [employer]. Patient alert and oriented, warm and dry, in no acute distress. Patient denies SI, HI, AVH and pain. Patient made aware of Q15 minute rounds and security cameras for their safety. Patient instructed to come to me with needs or concerns.

## 2021-05-01 NOTE — ED Notes (Signed)
Patient awakens and summons staff to bathroom. She was asked (by this RN) to provide urine for labwork and she refused. Labile mood and affect. Requested water and a blanket and she decided to drink water from the bathroom sink. Patient asked to lower her voice so that she would not interrupt patient's sleeping. Will continue to monitor.
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
## 2021-05-01 NOTE — ED Notes (Signed)
Pt given 2x cups of water

## 2021-05-01 NOTE — ED Notes (Signed)
Patient is stable in NAD. She is calm and cooperative. She is transferred to [facility] via NT and officer. IVC paper and belongings given to BMU. Report given to [facility]. No issues.

## 2021-05-02 ENCOUNTER — Encounter: Payer: Self-pay | Admitting: Psychiatric/Mental Health

## 2021-05-02 ENCOUNTER — Other Ambulatory Visit: Payer: Self-pay

## 2021-05-02 DIAGNOSIS — F339 Major depressive disorder, recurrent, unspecified: Secondary | ICD-10-CM | POA: Diagnosis not present

## 2021-05-02 DIAGNOSIS — F1020 Alcohol dependence, uncomplicated: Secondary | ICD-10-CM

## 2021-05-02 MED ORDER — THIAMINE HCL 100 MG PO TABS
100.0000 mg | ORAL_TABLET | Freq: Every day | ORAL | Status: DC
  Administered 2021-05-03: 100 mg via ORAL
  Filled 2021-05-02: qty 1

## 2021-05-02 MED ORDER — CARVEDILOL 3.125 MG PO TABS
3.1250 mg | ORAL_TABLET | Freq: Two times a day (BID) | ORAL | Status: DC
  Administered 2021-05-02 – 2021-05-03 (×2): 3.125 mg via ORAL
  Filled 2021-05-02 (×4): qty 1

## 2021-05-02 MED ORDER — ADULT MULTIVITAMIN W/MINERALS CH
1.0000 | ORAL_TABLET | Freq: Every day | ORAL | Status: DC
  Administered 2021-05-02 – 2021-05-03 (×2): 1 via ORAL
  Filled 2021-05-02 (×2): qty 1

## 2021-05-02 MED ORDER — LOPERAMIDE HCL 2 MG PO CAPS: 2.0000 mg | ORAL_CAPSULE | ORAL | Status: DC | PRN

## 2021-05-02 MED ORDER — LORAZEPAM 1 MG PO TABS
1.0000 mg | ORAL_TABLET | Freq: Four times a day (QID) | ORAL | Status: DC | PRN
  Administered 2021-05-02: 1 mg via ORAL
  Filled 2021-05-02: qty 1

## 2021-05-02 MED ORDER — VENLAFAXINE HCL ER 75 MG PO CP24
75.0000 mg | ORAL_CAPSULE | Freq: Every day | ORAL | Status: DC
  Administered 2021-05-02 – 2021-05-03 (×2): 75 mg via ORAL
  Filled 2021-05-02 (×2): qty 1

## 2021-05-02 MED ORDER — HYDROXYZINE HCL 25 MG PO TABS
25.0000 mg | ORAL_TABLET | Freq: Four times a day (QID) | ORAL | Status: DC | PRN
  Administered 2021-05-02 (×2): 25 mg via ORAL
  Filled 2021-05-02 (×2): qty 1

## 2021-05-02 MED ORDER — CLONAZEPAM 0.5 MG PO TABS
0.5000 mg | ORAL_TABLET | Freq: Every day | ORAL | Status: DC
  Administered 2021-05-02: 0.5 mg via ORAL
  Filled 2021-05-02: qty 1

## 2021-05-02 MED ORDER — ONDANSETRON 4 MG PO TBDP: 4.0000 mg | ORAL_TABLET | Freq: Four times a day (QID) | ORAL | Status: DC | PRN

## 2021-05-02 MED ORDER — BUPROPION HCL ER (SR) 100 MG PO TB12
100.0000 mg | ORAL_TABLET | Freq: Two times a day (BID) | ORAL | Status: DC
  Administered 2021-05-02 – 2021-05-03 (×3): 100 mg via ORAL
  Filled 2021-05-02 (×5): qty 1

## 2021-05-02 MED ORDER — TRAZODONE HCL 100 MG PO TABS
100.0000 mg | ORAL_TABLET | Freq: Every evening | ORAL | Status: DC | PRN
  Administered 2021-05-02: 100 mg via ORAL
  Filled 2021-05-02: qty 1

## 2021-05-02 MED ORDER — LOSARTAN POTASSIUM 50 MG PO TABS
100.0000 mg | ORAL_TABLET | Freq: Every morning | ORAL | Status: DC
  Administered 2021-05-02 – 2021-05-03 (×2): 100 mg via ORAL
  Filled 2021-05-02 (×2): qty 2
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
## 2021-05-02 NOTE — Tx Team (Signed)
Initial Treatment Plan 05/02/2021 12:27 AM Don Perking VUY:[id number]    PATIENT STRESSORS: Marital or family conflict   PATIENT STRENGTHS: Ability for insight Capable of independent living Metallurgist fund of knowledge Religious Affiliation   PATIENT IDENTIFIED PROBLEMS:     Suicidal ideation  Alcohol abuse               DISCHARGE CRITERIA:  Ability to meet basic life and health needs Adequate post-discharge living arrangements Improved stabilization in mood, thinking, and/or behavior Medical problems require only outpatient monitoring Motivation to continue treatment in a less acute level of care Need for constant or close observation no longer present Reduction of life-threatening or endangering symptoms to within safe limits Safe-care adequate arrangements made Verbal commitment to aftercare and medication compliance  PRELIMINARY DISCHARGE PLAN: Outpatient therapy Return to previous living arrangement  PATIENT/FAMILY INVOLVEMENT: This treatment plan has been presented to and reviewed with the patient, Christina Long, and/or family member. The patient and family have been given the opportunity to ask questions and make suggestions.  Billy Coast, RN 05/02/2021, 12:27 AM

## 2021-05-02 NOTE — Plan of Care (Signed)
  Problem: Education: Goal: Knowledge of  General Education information/materials will improve Outcome: Progressing Goal: Emotional status will improve Outcome: Progressing Goal: Mental status will improve Outcome: Progressing Goal: Verbalization of understanding the information provided will improve Outcome: Progressing   Problem: Activity: Goal: Interest or engagement in activities will improve Outcome: Progressing Goal: Sleeping patterns will improve Outcome: Progressing   Problem: Coping: Goal: Ability to verbalize frustrations and anger appropriately will improve Outcome: Progressing Goal: Ability to demonstrate self-control will improve Outcome: Progressing   Problem: Health Behavior/Discharge Planning: Goal: Identification of resources available to assist in meeting health care needs will improve Outcome: Progressing Goal: Compliance with treatment plan for underlying cause of condition will improve Outcome: Progressing   Problem: Physical Regulation: Goal: Ability to maintain clinical measurements within normal limits will improve Outcome: Progressing   Problem: Safety: Goal: Periods of time without injury will increase Outcome: Progressing   Problem: Education: Goal: Utilization of techniques to improve thought processes will improve Outcome: Progressing Goal: Knowledge of the prescribed therapeutic regimen will improve Outcome: Progressing   Problem: Activity: Goal: Interest or engagement in leisure activities will improve Outcome: Progressing Goal: Imbalance in normal sleep/wake cycle will improve Outcome: Progressing   Problem: Coping: Goal: Coping ability will improve Outcome: Progressing Goal: Will verbalize feelings Outcome: Progressing   Problem: Health Behavior/Discharge Planning: Goal: Ability to make decisions will improve Outcome: Progressing Goal: Compliance with therapeutic regimen will improve Outcome: Progressing    Problem: Role Relationship: Goal: Will demonstrate positive changes in social behaviors and relationships Outcome: Progressing   Problem: Safety: Goal: Ability to disclose and discuss suicidal ideas will improve Outcome: Progressing Goal: Ability to identify and utilize support systems that promote safety will improve Outcome: Progressing   Problem: Self-Concept: Goal: Will verbalize positive feelings about self Outcome: Progressing Goal: Level of anxiety will decrease Outcome: Progressing   

## 2021-05-02 NOTE — Progress Notes (Signed)
   05/02/21 1700  Psych Admission Type (Psych Patients Only)  Admission Status Involuntary  Psychosocial Assessment  Patient Complaints Anxiety;Depression;Substance abuse;Worrying  Eye Contact Fair  Facial Expression Anxious  Affect Blunted  Speech Aggressive  Interaction Guarded;Minimal  Motor Activity Tremors  Appearance/Hygiene Disheveled  Behavior Characteristics Anxious;Irritable  Mood Irritable  Thought Process  Coherency WDL  Content WDL  Delusions None reported or observed  Perception WDL  Hallucination None reported or observed  Judgment Poor  Confusion None  Danger to Self  Current suicidal ideation? Denies  Danger to Others  Danger to Others None reported or observed   Patient minimal spend most of the day making phone calls stated she is worried about her job. Patient required encouragement to drink fluids. Prn vistaril given at [phone] for anxiety and Patient not effective, Prn Ativan given at 1159 and reported effective by 1300. At 1646 Patient reported anxiety 5/10, Prn vistaril 25 mg PO given.   Support and encouragement provided as needed. Patient remains safe.

## 2021-05-02 NOTE — [facility] Suicide Risk Assessment (Signed)
[facility] INPATIENT:  Family/Significant Other Suicide Prevention Education  Suicide Prevention Education:  Patient Refusal for Family/Significant Other Suicide Prevention Education: The patient Christina Long has refused to provide written consent for family/significant other to be provided Family/Significant Other Suicide Prevention Education during admission and/or prior to discharge.  Physician notified.  SPE completed with patient. Suicide education pamphlet was provided to patient.   Ileana Ladd Elizabth Palka 05/02/2021, 12:17 PM

## 2021-05-02 NOTE — BHH Counselor (Signed)
Pt declined consent to release current hospitalization records to psychiatrist for aftercare planning.  [address] [place], LCASA 05/02/21 11:48AM

## 2021-05-02 NOTE — [facility] Group Notes (Signed)
LCSW Group Therapy Note     05/02/2021 3:00PM    Type of Therapy and Topic:  Group Therapy: Coping Skills     Participation Level:  Did Not Attend       Description of Group: In this group, patients will learn about coping strategies. Patients will define what a coping strategy is and discuss how utilizing coping strategies can aid in emotional regulation and assist in the management of stress, anxiety, and depression symptoms. Patients will identify coping strategies that have helped them manage challenging emotions in the past and explore new coping strategies that they can begin to implement during times of emotional distress. Patients will learn the difference between positive and negative coping strategies and be encouraged to identify healthy replacements for unhelpful behaviors. Patients will be facilitated through the practice of a grounding technique to add to their coping strategy toolbox.         Therapeutic Goals:  1. Patient will be able to define what a coping strategy is and understand the importance of using coping skills for emotional well-being.  2. Patient will identify three coping strategies that they can practice when experiencing feelings of stress, anxiety, anger, and/or depression.  3. Patient will identify one unhelpful coping strategy they have used prior to admission and identify a healthy replacement they can practice instead.  4. Patient will learn one new grounding technique they can practice after discharge to aid in emotional regulation.      Summary of Patient Progress: Patient did not attend group despite encouraged participation.         Therapeutic Modalities:  Cognitive Behavioral Therapy Relapse Prevention Therapy Solution-Focused Therapy    Norberto Sorenson, LCSWA 05/02/2021 4:19 PM

## 2021-05-02 NOTE — Plan of Care (Signed)
Problem: Education: Goal: Knowledge of [facility] Education information/materials will improve 05/02/2021 0024 by Billy Coast, RN Outcome: Progressing 05/02/2021 0012 by Billy Coast, RN Outcome: Progressing Goal: Emotional status will improve 05/02/2021 0024 by Billy Coast, RN Outcome: Progressing 05/02/2021 0012 by Billy Coast, RN Outcome: Progressing Goal: Mental status will improve 05/02/2021 0024 by Billy Coast, RN Outcome: Progressing 05/02/2021 0012 by Billy Coast, RN Outcome: Progressing Goal: Verbalization of understanding the information provided will improve 05/02/2021 0024 by Billy Coast, RN Outcome: Progressing 05/02/2021 0012 by Billy Coast, RN Outcome: Progressing   Problem: Activity: Goal: Interest or engagement in activities will improve 05/02/2021 0024 by Billy Coast, RN Outcome: Progressing 05/02/2021 0012 by Billy Coast, RN Outcome: Progressing Goal: Sleeping patterns will improve 05/02/2021 0024 by Billy Coast, RN Outcome: Progressing 05/02/2021 0012 by Billy Coast, RN Outcome: Progressing   Problem: Coping: Goal: Ability to verbalize frustrations and anger appropriately will improve 05/02/2021 0024 by Billy Coast, RN Outcome: Progressing 05/02/2021 0012 by Billy Coast, RN Outcome: Progressing Goal: Ability to demonstrate self-control will improve 05/02/2021 0024 by Billy Coast, RN Outcome: Progressing 05/02/2021 0012 by Billy Coast, RN Outcome: Progressing   Problem: Health Behavior/Discharge Planning: Goal: Identification of resources available to assist in meeting health care needs will improve 05/02/2021 0024 by Billy Coast, RN Outcome: Progressing 05/02/2021 0012 by Billy Coast, RN Outcome: Progressing Goal: Compliance with treatment plan for underlying cause of condition will improve 05/02/2021 0024 by Billy Coast,  RN Outcome: Progressing 05/02/2021 0012 by Billy Coast, RN Outcome: Progressing   Problem: Physical Regulation: Goal: Ability to maintain clinical measurements within normal limits will improve 05/02/2021 0024 by Billy Coast, RN Outcome: Progressing 05/02/2021 0012 by Billy Coast, RN Outcome: Progressing   Problem: Safety: Goal: Periods of time without injury will increase 05/02/2021 0024 by Billy Coast, RN Outcome: Progressing 05/02/2021 0012 by Billy Coast, RN Outcome: Progressing   Problem: Education: Goal: Utilization of techniques to improve thought processes will improve 05/02/2021 0024 by Billy Coast, RN Outcome: Progressing 05/02/2021 0012 by Billy Coast, RN Outcome: Progressing Goal: Knowledge of the prescribed therapeutic regimen will improve 05/02/2021 0024 by Billy Coast, RN Outcome: Progressing 05/02/2021 0012 by Billy Coast, RN Outcome: Progressing   Problem: Activity: Goal: Interest or engagement in leisure activities will improve 05/02/2021 0024 by Billy Coast, RN Outcome: Progressing 05/02/2021 0012 by Billy Coast, RN Outcome: Progressing Goal: Imbalance in normal sleep/wake cycle will improve 05/02/2021 0024 by Billy Coast, RN Outcome: Progressing 05/02/2021 0012 by Billy Coast, RN Outcome: Progressing   Problem: Coping: Goal: Coping ability will improve 05/02/2021 0024 by Billy Coast, RN Outcome: Progressing 05/02/2021 0012 by Billy Coast, RN Outcome: Progressing Goal: Will verbalize feelings 05/02/2021 0024 by Billy Coast, RN Outcome: Progressing 05/02/2021 0012 by Billy Coast, RN Outcome: Progressing   Problem: Health Behavior/Discharge Planning: Goal: Ability to make decisions will improve 05/02/2021 0024 by Billy Coast, RN Outcome: Progressing 05/02/2021 0012 by Billy Coast, RN Outcome: Progressing Goal: Compliance with  therapeutic regimen will improve 05/02/2021 0024 by Billy Coast, RN Outcome: Progressing 05/02/2021 0012 by Billy Coast, RN Outcome: Progressing   Problem: Role Relationship: Goal: Will demonstrate positive changes in social behaviors and relationships 05/02/2021 0024 by Billy Coast, RN Outcome: Progressing 05/02/2021 0012 by Billy Coast, RN Outcome:  Progressing   Problem: Safety: Goal: Ability to disclose and discuss suicidal ideas will improve 05/02/2021 0024 by Billy Coast, RN Outcome: Progressing 05/02/2021 0012 by Billy Coast, RN Outcome: Progressing Goal: Ability to identify and utilize support systems that promote safety will improve 05/02/2021 0024 by Billy Coast, RN Outcome: Progressing 05/02/2021 0012 by Billy Coast, RN Outcome: Progressing   Problem: Self-Concept: Goal: Will verbalize positive feelings about self 05/02/2021 0024 by Billy Coast, RN Outcome: Progressing 05/02/2021 0012 by Billy Coast, RN Outcome: Progressing Goal: Level of anxiety will decrease 05/02/2021 0024 by Billy Coast, RN Outcome: Progressing 05/02/2021 0012 by Billy Coast, RN Outcome: Progressing
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
## 2021-05-02 NOTE — Progress Notes (Signed)
Patient has been guarded and isolative to self. Continues to minimize her drinking. Denies all withdrawal symptoms but still is having tremors. Denies SI, HI and AVH
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
## 2021-05-02 NOTE — Plan of Care (Signed)
  Problem: Education: Goal: Knowledge of [facility] Education information/materials will improve Outcome: Not Progressing Goal: Emotional status will improve Outcome: Not Progressing Goal: Mental status will improve Outcome: Not Progressing Goal: Verbalization of understanding the information provided will improve Outcome: Not Progressing   Problem: Activity: Goal: Interest or engagement in activities will improve Outcome: Not Progressing Goal: Sleeping patterns will improve Outcome: Not Progressing   Problem: Coping: Goal: Ability to verbalize frustrations and anger appropriately will improve Outcome: Not Progressing Goal: Ability to demonstrate self-control will improve Outcome: Not Progressing   Problem: Health Behavior/Discharge Planning: Goal: Identification of resources available to assist in meeting health care needs will improve Outcome: Not Progressing Goal: Compliance with treatment plan for underlying cause of condition will improve Outcome: Not Progressing   Problem: Physical Regulation: Goal: Ability to maintain clinical measurements within normal limits will improve Outcome: Not Progressing   Problem: Education: Goal: Utilization of techniques to improve thought processes will improve Outcome: Not Progressing Goal: Knowledge of the prescribed therapeutic regimen will improve Outcome: Not Progressing   Problem: Activity: Goal: Interest or engagement in leisure activities will improve Outcome: Not Progressing Goal: Imbalance in normal sleep/wake cycle will improve Outcome: Not Progressing   Problem: Coping: Goal: Coping ability will improve Outcome: Not Progressing   Problem: Coping: Goal: Coping ability will improve Outcome: Not Progressing   Problem: Coping: Goal: Coping ability will improve Outcome: Not Progressing Goal: Will verbalize feelings Outcome: Not Progressing   Problem: Health Behavior/Discharge Planning: Goal: Ability to  make decisions will improve Outcome: Not Progressing Goal: Compliance with therapeutic regimen will improve Outcome: Not Progressing
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
## 2021-05-03 DIAGNOSIS — F332 Major depressive disorder, recurrent severe without psychotic features: Principal | ICD-10-CM
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
## 2021-05-03 NOTE — BHH Counselor (Signed)
CSW met with pt briefly to discuss discharge plans. Pt stated that she plans to continue with her outpatient provider and declined CSW contacting provider to schedule aftercare. Pt denied any need for clothing but stated that she would need assistance with transportation home. [place] waiver was completed. No other concerns expressed. Contact ended without incident.   Chalmers Guest. Guerry Bruin, MSW, LCSW, [place] 05/03/2021 1:26 PM

## 2021-05-03 NOTE — Progress Notes (Signed)
  [facility] Adult Case Management Discharge Plan :  Will you be returning to the same living situation after discharge:  No. At discharge, do you have transportation home?: Yes,  CSW will assist with transportation arrangements. Do you have the ability to pay for your medications: Yes,  [employer] [facility]  Release of information consent forms completed and in the chart;  Patient's signature needed at discharge.  Patient to Follow up at:  Follow-up Information     [facility] Follow up.   Why: Although you have a provider currently, this is another resource in the community if needed. They have walk-in hours Monday, Wednesday, and Friday 8am to 4pm. Contact information: [address] [place] [address] [phone]         [facility] Follow up.   Why: Although you have a provider currently, this is another resource in the community if needed. They have walk-in hours Monday through Friday, 9am until 4pm. Contact information: [address] [place] [address] [phone]                 Next level of care provider has access to [facility] Link:no  Safety Planning and Suicide Prevention discussed: Yes,  SPE completed with pt.     Has patient been referred to the Quitline?: Patient refused referral  Patient has been referred for addiction treatment: Pt. refused referral  Glenis Smoker, LCSW 05/03/2021, 1:34 PM

## 2021-05-03 NOTE — Plan of Care (Signed)
Patient calm and cooperative this unit. Denies SI/HI/AH/VH. Denies anxiety, depression. Remains safe on unit and ready for discharge.  Problem: Education: Goal: Knowledge of [facility] Education information/materials will improve Outcome: Adequate for Discharge Goal: Emotional status will improve Outcome: Adequate for Discharge Goal: Mental status will improve Outcome: Adequate for Discharge Goal: Verbalization of understanding the information provided will improve Outcome: Adequate for Discharge   Problem: Activity: Goal: Interest or engagement in activities will improve Outcome: Adequate for Discharge Goal: Sleeping patterns will improve Outcome: Adequate for Discharge   Problem: Coping: Goal: Ability to verbalize frustrations and anger appropriately will improve Outcome: Adequate for Discharge Goal: Ability to demonstrate self-control will improve Outcome: Adequate for Discharge   Problem: Health Behavior/Discharge Planning: Goal: Identification of resources available to assist in meeting health care needs will improve Outcome: Adequate for Discharge Goal: Compliance with treatment plan for underlying cause of condition will improve Outcome: Adequate for Discharge   Problem: Physical Regulation: Goal: Ability to maintain clinical measurements within normal limits will improve Outcome: Adequate for Discharge   Problem: Safety: Goal: Periods of time without injury will increase Outcome: Adequate for Discharge   Problem: Education: Goal: Utilization of techniques to improve thought processes will improve Outcome: Adequate for Discharge Goal: Knowledge of the prescribed therapeutic regimen will improve Outcome: Adequate for Discharge   Problem: Activity: Goal: Interest or engagement in leisure activities will improve Outcome: Adequate for Discharge Goal: Imbalance in normal sleep/wake cycle will improve Outcome: Adequate for Discharge   Problem: Coping: Goal:  Coping ability will improve Outcome: Adequate for Discharge Goal: Will verbalize feelings Outcome: Adequate for Discharge   Problem: Health Behavior/Discharge Planning: Goal: Ability to make decisions will improve Outcome: Adequate for Discharge Goal: Compliance with therapeutic regimen will improve Outcome: Adequate for Discharge   Problem: Role Relationship: Goal: Will demonstrate positive changes in social behaviors and relationships Outcome: Adequate for Discharge   Problem: Safety: Goal: Ability to disclose and discuss suicidal ideas will improve Outcome: Adequate for Discharge Goal: Ability to identify and utilize support systems that promote safety will improve Outcome: Adequate for Discharge   Problem: Self-Concept: Goal: Will verbalize positive feelings about self Outcome: Adequate for Discharge Goal: Level of anxiety will decrease Outcome: Adequate for Discharge

## 2021-05-03 NOTE — BHH Group Notes (Signed)
LCSW Group Therapy Note     05/03/2021 2:15 PM     Type of Therapy and Topic:  Group Therapy:  Overcoming Obstacles     Participation Level:  Did Not Attend     Description of Group:     In this group patients will be encouraged to explore what they see as obstacles to their own wellness and recovery. They will be guided to discuss their thoughts, feelings, and behaviors related to these obstacles. The group will process together ways to cope with barriers, with attention given to specific choices patients can make. Each patient will be challenged to identify changes they are motivated to make in order to overcome their obstacles. This group will be process-oriented, with patients participating in exploration of their own experiences as well as giving and receiving support and challenge from other group members.     Therapeutic Goals:  1.    Patient will identify personal and current obstacles as they relate to admission.  2.    Patient will identify barriers that currently interfere with their wellness or overcoming obstacles.  3.    Patient will identify feelings, thought process and behaviors related to these barriers.  4.    Patient will identify two changes they are willing to make to overcome these obstacles:        Summary of Patient Progress: X     Therapeutic Modalities:    Cognitive Behavioral Therapy  Solution Focused Therapy  Motivational Interviewing  Relapse Prevention Therapy     Chandrea Zellman [place], MSW, LCSW-A  05/03/2021 2:15 PM

## 2021-05-03 NOTE — Progress Notes (Signed)
Recreation Therapy Notes  Date: 05/03/2021  Time: 10:00 am   Location: Courtyard    Behavioral response: N/A   Intervention Topic: Leisure    Discussion/Intervention: Patient did not attend group.   Clinical Observations/Feedback:  Patient did not attend group.   Christina Long LRT/CTRS        Christina Long 05/03/2021 11:30 AM

## 2021-05-03 NOTE — BHH Suicide Risk Assessment (Signed)
[facility] Discharge Suicide Risk Assessment   Principal Problem: Major depressive disorder, recurrent severe without psychotic features (HCC) Discharge Diagnoses: Principal Problem:   Major depressive disorder, recurrent severe without psychotic features (HCC) Active Problems:   Alcohol intoxication (HCC)   MDD (major depressive disorder)   Total Time spent with patient: 45 minutes  Musculoskeletal: Strength & Muscle Tone: within normal limits Gait & Station: normal Patient leans: N/A  Psychiatric Specialty Exam  Presentation  General Appearance:  No data recorded Eye Contact: No data recorded Speech: No data recorded Speech Volume: No data recorded Handedness: No data recorded  Mood and Affect  Mood: No data recorded Duration of Depression Symptoms: No data recorded Affect: No data recorded  Thought Process  Thought Processes: No data recorded Descriptions of Associations:No data recorded Orientation:No data recorded Thought Content:No data recorded History of Schizophrenia/Schizoaffective disorder:No data recorded Duration of Psychotic Symptoms:No data recorded Hallucinations:No data recorded Ideas of Reference:No data recorded Suicidal Thoughts:No data recorded Homicidal Thoughts:No data recorded  Sensorium  Memory: No data recorded Judgment: No data recorded Insight: No data recorded  Executive Functions  Concentration: No data recorded Attention Span: No data recorded Recall: No data recorded Fund of Knowledge: No data recorded Language: No data recorded  Psychomotor Activity  Psychomotor Activity: No data recorded  Assets  Assets: No data recorded  Sleep  Sleep: No data recorded  Physical Exam: Physical Exam Vitals and nursing note reviewed.  Constitutional:      Appearance: Normal appearance.  HENT:     Head: Normocephalic and atraumatic.     Mouth/Throat:     Pharynx: Oropharynx is clear.  Eyes:     Pupils: Pupils are  equal, round, and reactive to light.  Cardiovascular:     Rate and Rhythm: Normal rate and regular rhythm.  Pulmonary:     Effort: Pulmonary effort is normal.     Breath sounds: Normal breath sounds.  Abdominal:     General: Abdomen is flat.     Palpations: Abdomen is soft.  Musculoskeletal:        General: Normal range of motion.  Skin:    General: Skin is warm and dry.  Neurological:     General: No focal deficit present.     Mental Status: She is alert. Mental status is at baseline.  Psychiatric:        Mood and Affect: Mood normal.        Thought Content: Thought content normal.   Review of Systems  Constitutional: Negative.   HENT: Negative.    Eyes: Negative.   Respiratory: Negative.    Cardiovascular: Negative.   Gastrointestinal: Negative.   Musculoskeletal: Negative.   Skin: Negative.   Neurological: Negative.   Psychiatric/Behavioral:  Positive for memory loss and substance abuse. Negative for depression and suicidal ideas. The patient is nervous/anxious.   Blood pressure (!) 129/93, pulse 98, temperature 98.2 F (36.8 C), temperature source Oral, resp. rate 17, height 5\' 7"  (1.702 m), weight 63.5 kg, SpO2 100 %. Body mass index is 21.93 kg/m.  Mental Status Per Nursing Assessment::   On Admission:  Suicidal ideation indicated by others  Demographic Factors:  Caucasian  Loss Factors: Loss of significant relationship  Historical Factors: Impulsivity  Risk Reduction Factors:   Positive therapeutic relationship  Continued Clinical Symptoms:  Depression:   Comorbid alcohol abuse/dependence Alcohol/Substance Abuse/Dependencies  Cognitive Features That Contribute To Risk:  None    Suicide Risk:  Minimal: No identifiable suicidal ideation.  Patients presenting with  no risk factors but with morbid ruminations; may be classified as minimal risk based on the severity of the depressive symptoms   Follow-up Information     [facility] Follow  up.   Why: Although you have a provider currently, this is another resource in the community if needed. They have walk-in hours Monday, Wednesday, and Friday 8am to 4pm. Contact information: [address] [place] [address] [phone]         [facility] Follow up.   Why: Although you have a provider currently, this is another resource in the community if needed. They have walk-in hours Monday through Friday, 9am until 4pm. Contact information: [address] [place] [address] [phone]                 Plan Of Care/Follow-up recommendations:  Other:  No change to medication. No longer meets IVC criteria. No evidence of acute suicidal ideation. mFollow up with outpatient provider  Mordecai Rasmussen, MD 05/03/2021, 2:44 PM

## 2021-05-03 NOTE — Progress Notes (Signed)
Patient discharged from facility with all belongings and home medications. Medication returned to patient from pharmacy. Discharge paperwork reviewed and patient verbalized understanding. Patient denies SI/HI/AH/VH at discharge. Patient reports feeling safe and will remain safe.

## 2021-05-03 NOTE — Discharge Summary (Signed)
Physician Discharge Summary Note  Patient:  Christina Long is an 60 y.o., female MRN:  [id number] DOB:  09/25/1960 Patient phone:  [phone] (home)  Patient address:   [address] [place] [address],  Total Time spent with patient: 45 minutes  Date of Admission:  05/01/2021 Date of Discharge: May 03, 2021  Reason for Admission: Patient was admitted through the emergency room where she presented under IVC filed by her boyfriend.  IVC paperwork alleges that the patient had made suicidal and homicidal statements.  Patient was intoxicated on presentation and appeared erratic and unpredictable.  Principal Problem: Major depressive disorder, recurrent severe without psychotic features [facility]) Discharge Diagnoses: Principal Problem:   Major depressive disorder, recurrent severe without psychotic features (HCC) Active Problems:   Alcohol intoxication (HCC)   MDD (major depressive disorder)   Past Psychiatric History: History of of depression and possible history of alcohol abuse although patient minimizes it.  No prior suicide attempts or inpatient hospitalization.  Has an outpatient provider already established  Past Medical History:  Past Medical History:  Diagnosis Date   Hypertension    History reviewed. No pertinent surgical history. Family History: History reviewed. No pertinent family history. Family Psychiatric  History: None reported Social History:  Social History   Substance and Sexual Activity  Alcohol Use Yes     Social History   Substance and Sexual Activity  Drug Use Not Currently    Social History   Socioeconomic History   Marital status: Single    Spouse name: Not on file   Number of children: Not on file   Years of education: Not on file   Highest education level: Not on file  Occupational History   Not on file  Tobacco Use   Smoking status: Never   Smokeless tobacco: Never  Vaping Use   Vaping Use: Never used  Substance and Sexual  Activity   Alcohol use: Yes   Drug use: Not Currently   Sexual activity: Not on file  Other Topics Concern   Not on file  Social History Narrative   Not on file   Social Determinants of Health   Financial Resource Strain: Not on file  Food Insecurity: Not on file  Transportation Needs: Not on file  Physical Activity: Not on file  Stress: Not on file  Social Connections: Not on file    Hospital Course: Admitted to the hospital and placed on detox orders.  No seizures no signs of delirium noted.  Patient is mildly tremulous but minimizes it and expresses no concern about it.  Patient minimizes her concern about her alcohol abuse although acknowledges that she might do better to cut back on her drinking.  She has consistently denied suicidal or homicidal ideation.  No indication at this point for change in medicine.  No longer meets commitment criteria.  Patient is requesting discharge to follow up with her outpatient psychiatrist.  Physical Findings: AIMS:  , ,  ,  ,    CIWA:  CIWA-Ar Total: 2 COWS:     Musculoskeletal: Strength & Muscle Tone: within normal limits Gait & Station: normal Patient leans: N/A   Psychiatric Specialty Exam:  Presentation  General Appearance:  No data recorded Eye Contact: No data recorded Speech: No data recorded Speech Volume: No data recorded Handedness: No data recorded  Mood and Affect  Mood: No data recorded Affect: No data recorded  Thought Process  Thought Processes: No data recorded Descriptions of Associations:No data recorded Orientation:No  data recorded Thought Content:No data recorded History of Schizophrenia/Schizoaffective disorder:No data recorded Duration of Psychotic Symptoms:No data recorded Hallucinations:No data recorded Ideas of Reference:No data recorded Suicidal Thoughts:No data recorded Homicidal Thoughts:No data recorded  Sensorium  Memory: No data recorded Judgment: No data recorded Insight: No  data recorded  Executive Functions  Concentration: No data recorded Attention Span: No data recorded Recall: No data recorded Fund of Knowledge: No data recorded Language: No data recorded  Psychomotor Activity  Psychomotor Activity: No data recorded  Assets  Assets: No data recorded  Sleep  Sleep: No data recorded   Physical Exam: Physical Exam Vitals and nursing note reviewed.  Constitutional:      Appearance: Normal appearance.  HENT:     Head: Normocephalic and atraumatic.     Mouth/Throat:     Pharynx: Oropharynx is clear.  Eyes:     Pupils: Pupils are equal, round, and reactive to light.  Cardiovascular:     Rate and Rhythm: Normal rate and regular rhythm.  Pulmonary:     Effort: Pulmonary effort is normal.     Breath sounds: Normal breath sounds.  Abdominal:     General: Abdomen is flat.     Palpations: Abdomen is soft.  Musculoskeletal:        General: Normal range of motion.  Skin:    General: Skin is warm and dry.  Neurological:     General: No focal deficit present.     Mental Status: She is alert. Mental status is at baseline.  Psychiatric:        Mood and Affect: Mood normal.        Thought Content: Thought content normal.   Review of Systems  Constitutional: Negative.   HENT: Negative.    Eyes: Negative.   Respiratory: Negative.    Cardiovascular: Negative.   Gastrointestinal: Negative.   Musculoskeletal: Negative.   Skin: Negative.   Neurological: Negative.   Psychiatric/Behavioral:  Positive for substance abuse. Negative for depression, hallucinations and suicidal ideas. The patient is nervous/anxious.   Blood pressure (!) 129/93, pulse 98, temperature 98.2 F (36.8 C), temperature source Oral, resp. rate 17, height 5\' 7"  (1.702 m), weight 63.5 kg, SpO2 100 %. Body mass index is 21.93 kg/m.   Social History   Tobacco Use  Smoking Status Never  Smokeless Tobacco Never   Tobacco Cessation:  N/A, patient does not currently  use tobacco products   Blood Alcohol level:  Lab Results  Component Value Date   ETH 278 (H) 04/30/2021   ETH 277 (H) 04/29/2021    Metabolic Disorder Labs:  No results found for: HGBA1C, MPG No results found for: PROLACTIN No results found for: CHOL, TRIG, HDL, CHOLHDL, VLDL, LDLCALC  See Psychiatric Specialty Exam and Suicide Risk Assessment completed by Attending Physician prior to discharge.  Discharge destination:  Home  Is patient on multiple antipsychotic therapies at discharge:  No   Has Patient had three or more failed trials of antipsychotic monotherapy by history:  No  Recommended Plan for Multiple Antipsychotic Therapies: NA  Discharge Instructions     Diet - low sodium heart healthy   Complete by: As directed    Increase activity slowly   Complete by: As directed       Allergies as of 05/03/2021       Reactions   Sertraline Rash        Medication List     TAKE these medications      Indication  buPROPion 150  MG 12 hr tablet Commonly known as: WELLBUTRIN SR Take 150 mg by mouth 2 (two) times daily.  Indication: Major Depressive Disorder   carvedilol 3.125 MG tablet Commonly known as: COREG Take 3.125 mg by mouth 2 (two) times daily.  Indication: High Blood Pressure Disorder   losartan 100 MG tablet Commonly known as: COZAAR Take 100 mg by mouth daily.  Indication: High Blood Pressure Disorder   naltrexone 50 MG tablet Commonly known as: DEPADE Take 50 mg by mouth daily.  Indication: Abuse or Misuse of Alcohol   traZODone 100 MG tablet Commonly known as: DESYREL Take 100 mg by mouth at bedtime.  Indication: Trouble Sleeping   venlafaxine XR 75 MG 24 hr capsule Commonly known as: EFFEXOR-XR Take 75 mg by mouth daily.  Indication: Major Depressive Disorder        Follow-up Information     [facility] Follow up.   Why: Although you have a provider currently, this is another resource in the community if needed.  They have walk-in hours Monday, Wednesday, and Friday 8am to 4pm. Contact information: [address] [place] [address] [phone]         [facility] Follow up.   Why: Although you have a provider currently, this is another resource in the community if needed. They have walk-in hours Monday through Friday, 9am until 4pm. Contact information: [address] [place] [address] [phone]                 Follow-up recommendations:  Other:  recommend follow up primary psychiatric provider and no medication change  Comments:  no new prescriptions  Signed: Mordecai Rasmussen, MD 05/03/2021, 2:48 PM

## 2021-10-04 IMAGING — CT CT CERVICAL SPINE W/O CM
3 of 4 series · 12 of 33 positions shown, 14 images · non-contrast
Comparison: None.
COMPARISON: None.

Addendum:
CLINICAL DATA: Facial trauma.  Fall in jail.

EXAM:
CT HEAD WITHOUT CONTRAST
CT CERVICAL SPINE WITHOUT CONTRAST
TECHNIQUE: Multidetector CT imaging of the head and cervical spine was
performed following the standard protocol without intravenous
contrast. Multiplanar CT image reconstructions of the cervical spine
were also generated.

[Series 6: orthogonal bone · axial · 0.23mm/px · z∈[+252,+386]mm · 4 of 105 slices shown, 5 images]
[im 18/105  soft-tissue]
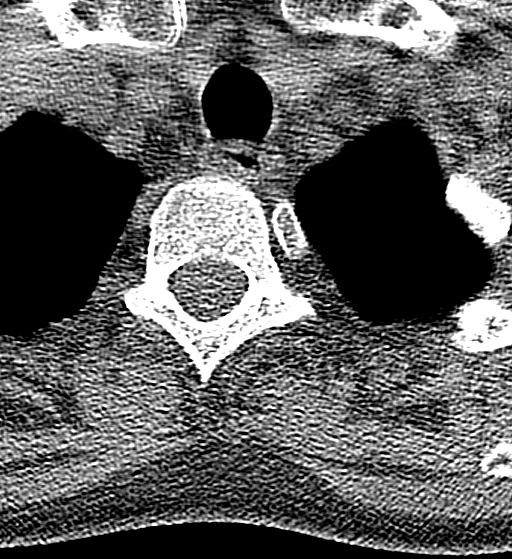
[im 18/105  bone]
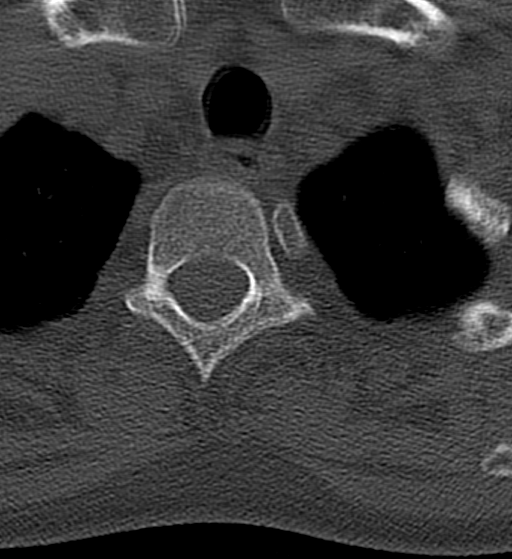
[im 35/105  bone]
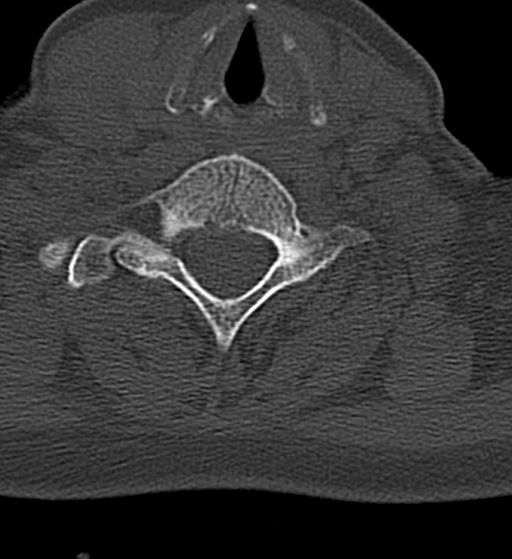
[im 70/105  bone]
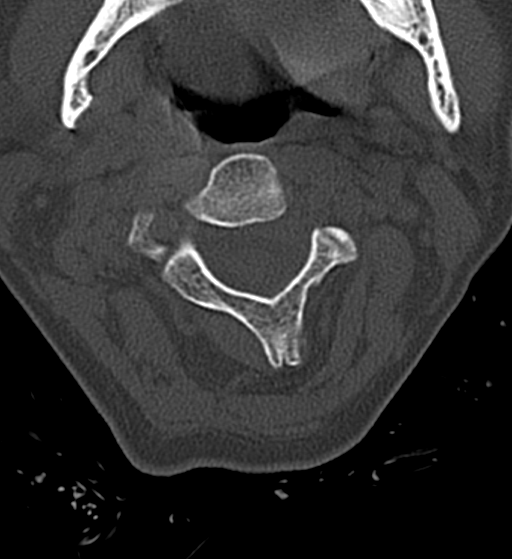
[im 87/105  bone]
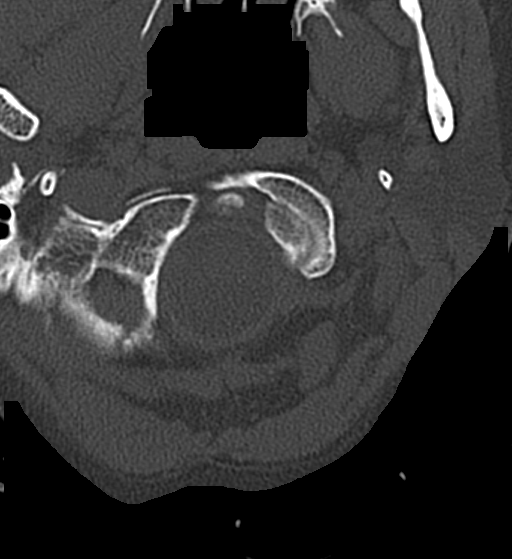

[Series 7: sagittal bone · sagittal · 0.29mm/px · 5 of 61 slices shown, 6 images]
[im 21/61  bone]
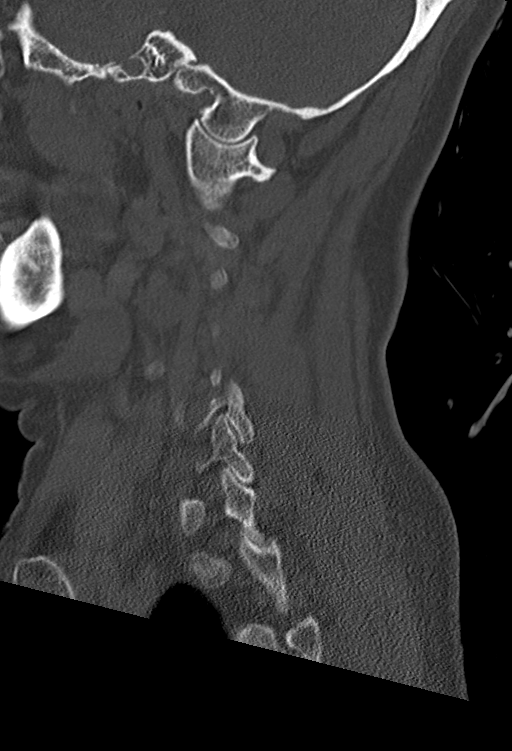
[im 26/61  bone]
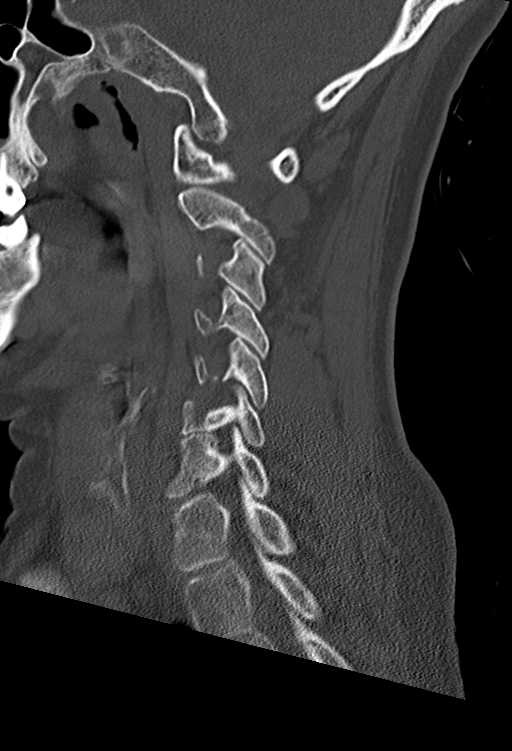
[im 31/61  soft-tissue]
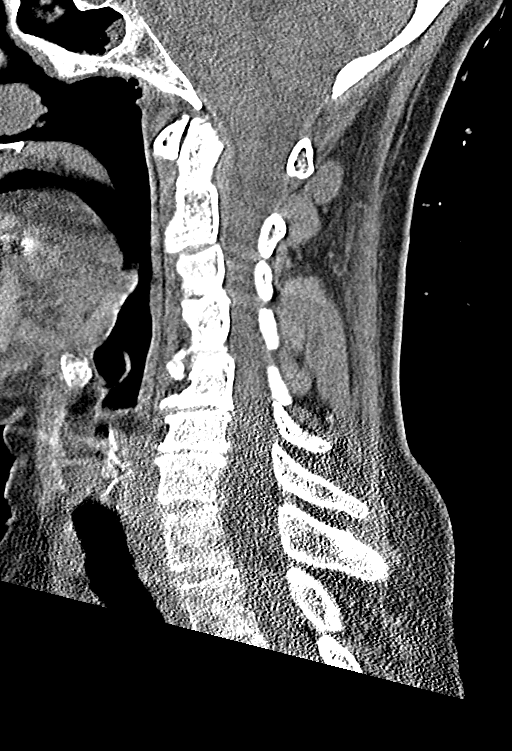
[im 31/61  bone]
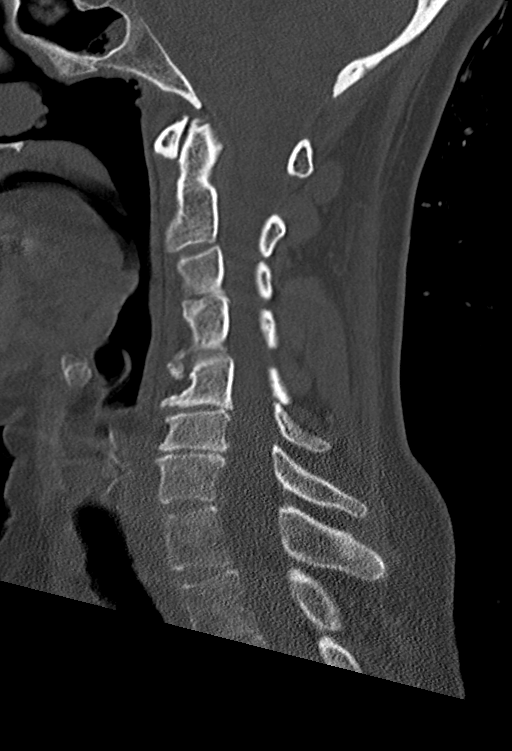
[im 36/61  bone]
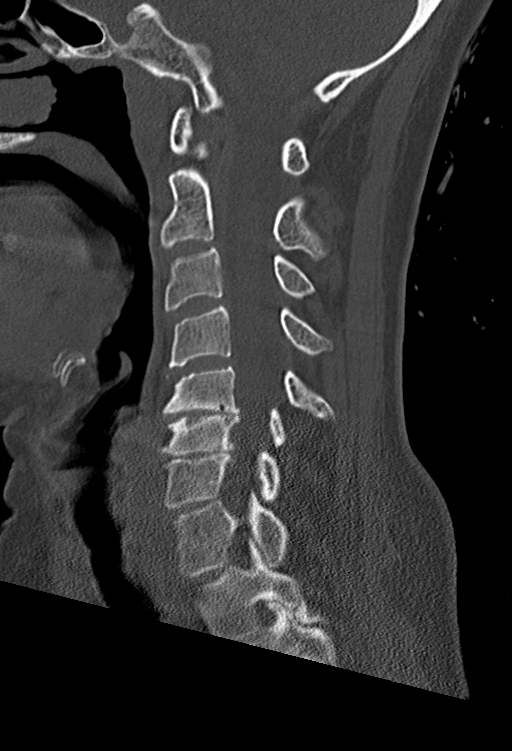
[im 41/61  bone]
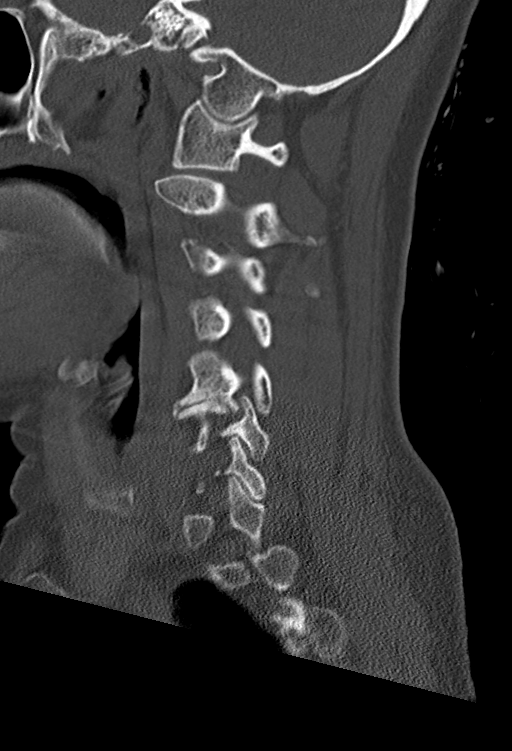

[Series 8: coronal bone · coronal · 0.29mm/px · 3 of 70 slices shown]
[im 14/70  bone]
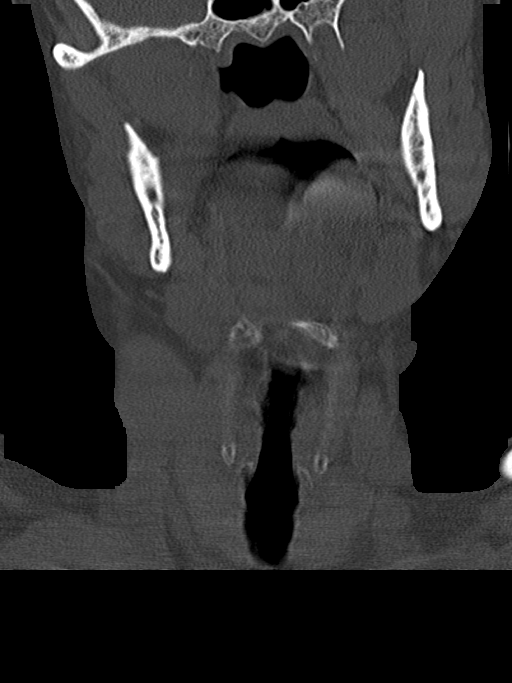
[im 28/70  bone]
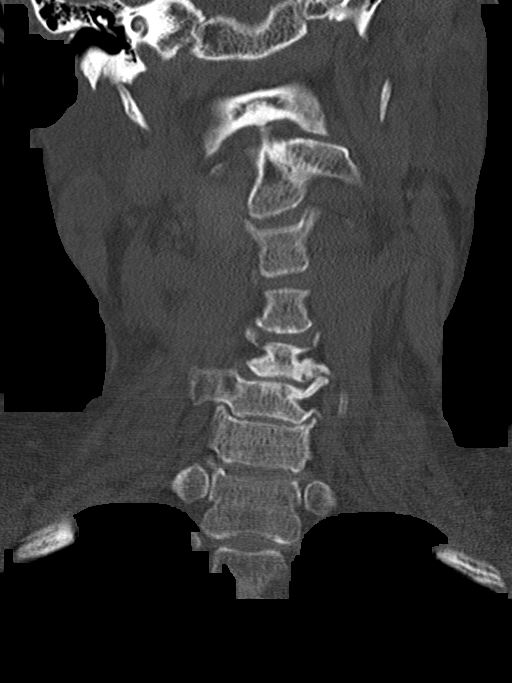
[im 42/70  bone]
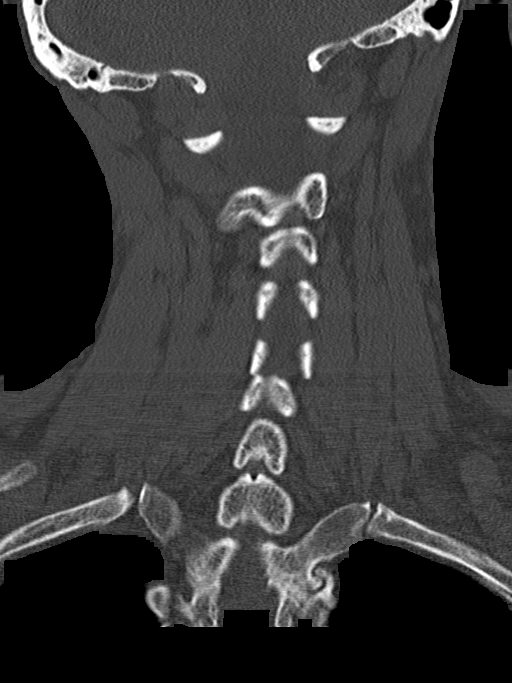

[12 of 33 positions shown; findings below may reference images not displayed]

FINDINGS: CT HEAD FINDINGS

Brain:

Cerebral ventricle sizes are concordant with the degree of cerebral
volume loss. Patchy and confluent areas of decreased attenuation are
noted throughout the deep and periventricular white matter of the
cerebral hemispheres bilaterally, compatible with chronic
microvascular ischemic disease. No evidence of large-territorial
acute infarction. No parenchymal hemorrhage. No mass lesion. No
extra-axial collection.

No mass effect or midline shift. No hydrocephalus. Basilar cisterns
are patent.

Vascular: No hyperdense vessel.

Skull: No acute fracture or focal lesion.

Sinuses/Orbits: Right sphenoid sinus mucosal thickening. Otherwise
the remaining visualized paranasal sinuses and mastoid air cells are
clear. The orbits are unremarkable.

Other: None.

CT CERVICAL SPINE FINDINGS

Alignment: Normal.

Skull base and vertebrae: Multilevel degenerative changes of the
spine most prominent at the C5-C6 levels. No acute fracture. No
aggressive appearing focal osseous lesion or focal pathologic
process.

Soft tissues and spinal canal: No prevertebral fluid or swelling. No
visible canal hematoma.

Upper chest: Unremarkable.

Other: None.
IMPRESSION: 1. No acute intracranial abnormality.
2. Displaced fracture or traumatic listhesis of the cervical spine.

ADDENDUM:
Please note impression should state: "NO acute displaced fracture or
traumatic listhesis of the cervical spine."

*** End of Addendum ***
FINDINGS: CT HEAD FINDINGS

Brain:

Cerebral ventricle sizes are concordant with the degree of cerebral
volume loss. Patchy and confluent areas of decreased attenuation are
noted throughout the deep and periventricular white matter of the
cerebral hemispheres bilaterally, compatible with chronic
microvascular ischemic disease. No evidence of large-territorial
acute infarction. No parenchymal hemorrhage. No mass lesion. No
extra-axial collection.

No mass effect or midline shift. No hydrocephalus. Basilar cisterns
are patent.

Vascular: No hyperdense vessel.

Skull: No acute fracture or focal lesion.

Sinuses/Orbits: Right sphenoid sinus mucosal thickening. Otherwise
the remaining visualized paranasal sinuses and mastoid air cells are
clear. The orbits are unremarkable.

Other: None.

CT CERVICAL SPINE FINDINGS

Alignment: Normal.

Skull base and vertebrae: Multilevel degenerative changes of the
spine most prominent at the C5-C6 levels. No acute fracture. No
aggressive appearing focal osseous lesion or focal pathologic
process.

Soft tissues and spinal canal: No prevertebral fluid or swelling. No
visible canal hematoma.

Upper chest: Unremarkable.

Other: None.
IMPRESSION: 1. No acute intracranial abnormality.
2. Displaced fracture or traumatic listhesis of the cervical spine.
